# Patient Record
Sex: Female | Born: 1940 | Race: White | Hispanic: No
Health system: Southern US, Community
[De-identification: ages and names within clinical notes are randomized; demographics above are authoritative.]

---

## 2020-04-17 ENCOUNTER — Inpatient Hospital Stay
Admission: RE | Admit: 2020-04-17 | Discharge: 2020-04-30 | Disposition: A | Discharge status: Home / Self Care | Payer: Medicare PPO | Attending: Internal Medicine | Admitting: Internal Medicine

## 2020-04-17 ENCOUNTER — Other Ambulatory Visit (HOSPITAL_COMMUNITY): Payer: Medicare PPO

## 2020-04-17 DIAGNOSIS — Z4659 Encounter for fitting and adjustment of other gastrointestinal appliance and device: Secondary | ICD-10-CM

## 2020-04-17 DIAGNOSIS — J939 Pneumothorax, unspecified: Secondary | ICD-10-CM

## 2020-04-17 DIAGNOSIS — J9690 Respiratory failure, unspecified, unspecified whether with hypoxia or hypercapnia: Secondary | ICD-10-CM

## 2020-04-17 DIAGNOSIS — Z938 Other artificial opening status: Secondary | ICD-10-CM

## 2020-04-17 DIAGNOSIS — J189 Pneumonia, unspecified organism: Secondary | ICD-10-CM

## 2020-04-17 DIAGNOSIS — Z9289 Personal history of other medical treatment: Secondary | ICD-10-CM

## 2020-04-17 DIAGNOSIS — J942 Hemothorax: Secondary | ICD-10-CM

## 2020-04-17 LAB — COMPREHENSIVE METABOLIC PANEL
ALT: 41 U/L (ref 0–44)
AST: 71 U/L — ABNORMAL HIGH (ref 15–41)
Albumin: 2.3 g/dL — ABNORMAL LOW (ref 3.5–5.0)
Alkaline Phosphatase: 93 U/L (ref 38–126)
Anion gap: 13 (ref 5–15)
BUN: 73 mg/dL — ABNORMAL HIGH (ref 8–23)
CO2: 25 mmol/L (ref 22–32)
Calcium: 8.6 mg/dL — ABNORMAL LOW (ref 8.9–10.3)
Chloride: 98 mmol/L (ref 98–111)
Creatinine, Ser: 4.11 mg/dL — ABNORMAL HIGH (ref 0.44–1.00)
GFR, Estimated: 10 mL/min — ABNORMAL LOW (ref >60)
Glucose, Bld: 143 mg/dL — ABNORMAL HIGH (ref 70–99)
Potassium: 3.7 mmol/L (ref 3.5–5.1)
Sodium: 136 mmol/L (ref 135–145)
Total Bilirubin: 1.1 mg/dL (ref 0.3–1.2)
Total Protein: 6.2 g/dL — ABNORMAL LOW (ref 6.5–8.1)

## 2020-04-18 LAB — COMPREHENSIVE METABOLIC PANEL
ALT: 43 U/L (ref 0–44)
AST: 66 U/L — ABNORMAL HIGH (ref 15–41)
Albumin: 2.3 g/dL — ABNORMAL LOW (ref 3.5–5.0)
Alkaline Phosphatase: 97 U/L (ref 38–126)
Anion gap: 15 (ref 5–15)
BUN: 76 mg/dL — ABNORMAL HIGH (ref 8–23)
CO2: 24 mmol/L (ref 22–32)
Calcium: 8.6 mg/dL — ABNORMAL LOW (ref 8.9–10.3)
Chloride: 98 mmol/L (ref 98–111)
Creatinine, Ser: 4.15 mg/dL — ABNORMAL HIGH (ref 0.44–1.00)
GFR, Estimated: 10 mL/min — ABNORMAL LOW (ref >60)
Glucose, Bld: 126 mg/dL — ABNORMAL HIGH (ref 70–99)
Potassium: 3.6 mmol/L (ref 3.5–5.1)
Sodium: 137 mmol/L (ref 135–145)
Total Bilirubin: 1 mg/dL (ref 0.3–1.2)
Total Protein: 6.3 g/dL — ABNORMAL LOW (ref 6.5–8.1)

## 2020-04-18 LAB — CBC WITH DIFFERENTIAL/PLATELET
Abs Immature Granulocytes: 0.09 10*3/uL — ABNORMAL HIGH (ref 0.00–0.07)
Basophils Absolute: 0.1 10*3/uL (ref 0.0–0.1)
Basophils Relative: 1 %
Eosinophils Absolute: 0.9 10*3/uL — ABNORMAL HIGH (ref 0.0–0.5)
Eosinophils Relative: 9 %
HCT: 30 % — ABNORMAL LOW (ref 36.0–46.0)
Hemoglobin: 9.5 g/dL — ABNORMAL LOW (ref 12.0–15.0)
Immature Granulocytes: 1 %
Lymphocytes Relative: 11 %
Lymphs Abs: 1.1 10*3/uL (ref 0.7–4.0)
MCH: 29.8 pg (ref 26.0–34.0)
MCHC: 31.7 g/dL (ref 30.0–36.0)
MCV: 94 fL (ref 80.0–100.0)
Monocytes Absolute: 0.6 10*3/uL (ref 0.1–1.0)
Monocytes Relative: 6 %
Neutro Abs: 6.7 10*3/uL (ref 1.7–7.7)
Neutrophils Relative %: 72 %
Platelets: 375 10*3/uL (ref 150–400)
RBC: 3.19 MIL/uL — ABNORMAL LOW (ref 3.87–5.11)
RDW: 15.3 % (ref 11.5–15.5)
WBC: 9.5 10*3/uL (ref 4.0–10.5)
nRBC: 0 % (ref 0.0–0.2)

## 2020-04-18 LAB — HEMOGLOBIN A1C
Hgb A1c MFr Bld: 5.4 % (ref 4.8–5.6)
Mean Plasma Glucose: 108.28 mg/dL

## 2020-04-18 LAB — HEPATITIS B SURFACE ANTIGEN: Hepatitis B Surface Ag: NONREACTIVE

## 2020-04-19 LAB — HEPATITIS B SURFACE ANTIBODY,QUALITATIVE: Hep B S Ab: REACTIVE — AB

## 2020-04-20 LAB — CBC
HCT: 28.4 % — ABNORMAL LOW (ref 36.0–46.0)
Hemoglobin: 8.8 g/dL — ABNORMAL LOW (ref 12.0–15.0)
MCH: 29.8 pg (ref 26.0–34.0)
MCHC: 31 g/dL (ref 30.0–36.0)
MCV: 96.3 fL (ref 80.0–100.0)
Platelets: 333 10*3/uL (ref 150–400)
RBC: 2.95 MIL/uL — ABNORMAL LOW (ref 3.87–5.11)
RDW: 14.9 % (ref 11.5–15.5)
WBC: 9.3 10*3/uL (ref 4.0–10.5)
nRBC: 0 % (ref 0.0–0.2)

## 2020-04-20 LAB — RENAL FUNCTION PANEL
Albumin: 2.3 g/dL — ABNORMAL LOW (ref 3.5–5.0)
Anion gap: 10 (ref 5–15)
BUN: 46 mg/dL — ABNORMAL HIGH (ref 8–23)
CO2: 27 mmol/L (ref 22–32)
Calcium: 8.6 mg/dL — ABNORMAL LOW (ref 8.9–10.3)
Chloride: 99 mmol/L (ref 98–111)
Creatinine, Ser: 2.89 mg/dL — ABNORMAL HIGH (ref 0.44–1.00)
GFR, Estimated: 16 mL/min — ABNORMAL LOW (ref >60)
Glucose, Bld: 111 mg/dL — ABNORMAL HIGH (ref 70–99)
Phosphorus: 4.4 mg/dL (ref 2.5–4.6)
Potassium: 4.1 mmol/L (ref 3.5–5.1)
Sodium: 136 mmol/L (ref 135–145)

## 2020-04-20 NOTE — Consult Note (Signed)
[facility] CONSULT NOTE    Date: 04/20/2020                  Patient Name:  Christine Cohen  MRN: [id number]  DOB: 1941-04-03  Age / Sex: 79 y.o., female         PCP: Pcp, No                 Service Requesting Consult:  Hospitalist                 Reason for Consult:  Evaluation management of acute kidney injury            History of Present Illness: Patient is a 79 y.o. 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  Patient reports that she was not on dialysis prior to her most recent hospitalization.  Her baseline creatinine recently is range between 0.8-1.0.  She has a right internal jugular PermCath in place.  Currently she has a BUN of 76 with a creatinine of 4.15.  She also has associated anemia with hemoglobin of 9.5.  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  She currently denies nausea, vomiting.   Medications:  Current medications: Amiodarone 200 mg daily, aspirin 81 mg daily, atorvastatin 40 mg daily, duloxetine 30 mg daily, fish oil 1 g daily, heparin 5000 units subcutaneous every 8 hours, hydralazine 12.5 mg every 12 hours, acetaminophen 650 mg 4 times daily, metoprolol 25 mg twice daily, renal vitamin 1 tablet daily, prostat 30 cc daily   Allergies: Penicillin, amlodipine   Past Medical History: hypertension, hyperlipidemia, coronary disease status post CABG times 411/3/21, atrial fibrillation, high chest tube output, acute respiratory failure, acute kidney injury secondary to ischemic ATN  Past Surgical History: Four-vessel CABG 03/25/2020 PermCath placement  Family History: Mother and mother both with history  of coronary artery disease, both deceased  Social History: No current tobacco, alcohol, illicit drug use  Review of Systems: Review of Systems  Constitutional: Positive for malaise/fatigue. Negative for chills and fever.  HENT: Negative for congestion, hearing loss and tinnitus.   Eyes: Negative for blurred vision and double vision.  Respiratory: Positive for shortness of breath. Negative for cough and sputum production.   Cardiovascular: Negative for chest pain, palpitations and orthopnea.  Gastrointestinal: Negative for diarrhea, nausea and vomiting.  Genitourinary: Negative for dysuria, frequency and urgency.  Musculoskeletal: Negative for myalgias.  Skin: Negative for itching and rash.  Neurological: Negative for dizziness and focal weakness.  Endo/Heme/Allergies: Negative for polydipsia. Does not bruise/bleed easily.  Psychiatric/Behavioral: Negative for depression. The patient is not nervous/anxious.      Vital Signs: Temperature 97.4 pulse 82 respirations 16 blood pressure 124/68  Weight trends: There were no vitals filed for this visit.  Physical Exam: Physical Exam: General:  No acute distress  Head:  Normocephalic, atraumatic. Moist oral mucosal membranes  Eyes:  Anicteric  Neck:  Supple  Lungs:   Clear to auscultation, normal effort, chest tube in place  Heart:  S1S2 irregular  Abdomen:   Soft, nontender, bowel sounds present  Extremities:  No peripheral edema.  Neurologic:  Awake, alert, following commands  Skin:  No lesions  Access:  Right IJ  PermCath    Lab results: Basic Metabolic Panel: Recent Labs  Lab 04/17/20 1815 04/18/20 0502  NA 136 137  K 3.7 3.6  CL 98 98  CO2 25 24  GLUCOSE 143* 126*  BUN 73* 76*  CREATININE 4.11* 4.15*  CALCIUM 8.6* 8.6*    Liver Function Tests: Recent Labs  Lab 04/17/20 1815 04/18/20 0502  AST 71* 66*  ALT 41 43  ALKPHOS 93 97  BILITOT 1.1 1.0  PROT 6.2* 6.3*  ALBUMIN 2.3* 2.3*   No results for  input(s): LIPASE, AMYLASE in the last 168 hours. No results for input(s): AMMONIA in the last 168 hours.  CBC: Recent Labs  Lab 04/18/20 0502  WBC 9.5  NEUTROABS 6.7  HGB 9.5*  HCT 30.0*  MCV 94.0  PLT 375    Cardiac Enzymes: No results for input(s): CKTOTAL, CKMB, CKMBINDEX, TROPONINI in the last 168 hours.  BNP: Invalid input(s): POCBNP  CBG: No results for input(s): GLUCAP in the last 168 hours.  Microbiology: No results found for this or any previous visit.  Coagulation Studies: No results for input(s): LABPROT, INR in the last 72 hours.  Urinalysis: No results for input(s): COLORURINE, LABSPEC, PHURINE, GLUCOSEU, HGBUR, BILIRUBINUR, KETONESUR, PROTEINUR, UROBILINOGEN, NITRITE, LEUKOCYTESUR in the last 72 hours.  Invalid input(s): APPERANCEUR    Imaging:  No results found.   Assessment & Plan: Pt is a 79 y.o. 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  1.  Acute kidney injury secondary to ischemic ATN.  Baseline creatinine was 0.8-1.0 preoperatively.  Does not appear to be making much urine.  Continue dialysis on MWF schedule for now.  We will use her right internal jugular PermCath.  2.  Anemia of chronic kidney disease.  Hemoglobin 9.5.  Hold off on Epogen for now.  3.  Secondary hyperparathyroidism.  Serum calcium noted to be a bit low at 8.6.  Albumin also low at 2.3.  Check serum phosphorus as well.  4.  Thanks for consultation.

## 2020-04-21 ENCOUNTER — Other Ambulatory Visit (HOSPITAL_COMMUNITY): Payer: Medicare PPO

## 2020-04-22 LAB — CBC
HCT: 28.2 % — ABNORMAL LOW (ref 36.0–46.0)
Hemoglobin: 9 g/dL — ABNORMAL LOW (ref 12.0–15.0)
MCH: 30.4 pg (ref 26.0–34.0)
MCHC: 31.9 g/dL (ref 30.0–36.0)
MCV: 95.3 fL (ref 80.0–100.0)
Platelets: 309 10*3/uL (ref 150–400)
RBC: 2.96 MIL/uL — ABNORMAL LOW (ref 3.87–5.11)
RDW: 14.7 % (ref 11.5–15.5)
WBC: 9.9 10*3/uL (ref 4.0–10.5)
nRBC: 0 % (ref 0.0–0.2)

## 2020-04-22 LAB — RENAL FUNCTION PANEL
Albumin: 2.3 g/dL — ABNORMAL LOW (ref 3.5–5.0)
Anion gap: 12 (ref 5–15)
BUN: 40 mg/dL — ABNORMAL HIGH (ref 8–23)
CO2: 26 mmol/L (ref 22–32)
Calcium: 8.7 mg/dL — ABNORMAL LOW (ref 8.9–10.3)
Chloride: 99 mmol/L (ref 98–111)
Creatinine, Ser: 2.4 mg/dL — ABNORMAL HIGH (ref 0.44–1.00)
GFR, Estimated: 20 mL/min — ABNORMAL LOW (ref >60)
Glucose, Bld: 102 mg/dL — ABNORMAL HIGH (ref 70–99)
Phosphorus: 3.9 mg/dL (ref 2.5–4.6)
Potassium: 3.7 mmol/L (ref 3.5–5.1)
Sodium: 137 mmol/L (ref 135–145)

## 2020-04-22 NOTE — Progress Notes (Signed)
[facility]  ROUNDING NOTE   Subjective:  Patient seen and evaluated at bedside. Renal parameters have improved with dialysis. Serum phosphorus at target at 3.9.  Objective:  Vital signs in last 24 hours:  Temperature 96.4 pulse 79 respiration 17 blood pressure 132 /64   Physical Exam: General:  No acute distress  Head:  Normocephalic, atraumatic. Moist oral mucosal membranes  Eyes:  Anicteric  Neck:  Status post tracheostomy decannulation  Lungs:   Clear to auscultation, normal effort  Heart:  S1S2 no rubs  Abdomen:   Soft, nontender, bowel sounds present  Extremities:  Trace peripheral edema.  Neurologic:  Awake, alert, following commands  Skin:  No lesions  Access:  Right IJ PermCath    Basic Metabolic Panel: Recent Labs  Lab 04/17/20 1815 04/17/20 1815 04/18/20 0502 04/20/20 1014 04/22/20 0426  NA 136  --  137 136 137  K 3.7  --  3.6 4.1 3.7  CL 98  --  98 99 99  CO2 25  --  24 27 26   GLUCOSE 143*  --  126* 111* 102*  BUN 73*  --  76* 46* 40*  CREATININE 4.11*  --  4.15* 2.89* 2.40*  CALCIUM 8.6*   < > 8.6* 8.6* 8.7*  PHOS  --   --   --  4.4 3.9   < > = values in this interval not displayed.    Liver Function Tests: Recent Labs  Lab 04/17/20 1815 04/18/20 0502 04/20/20 1014 04/22/20 0426  AST 71* 66*  --   --   ALT 41 43  --   --   ALKPHOS 93 97  --   --   BILITOT 1.1 1.0  --   --   PROT 6.2* 6.3*  --   --   ALBUMIN 2.3* 2.3* 2.3* 2.3*   No results for input(s): LIPASE, AMYLASE in the last 168 hours. No results for input(s): AMMONIA in the last 168 hours.  CBC: Recent Labs  Lab 04/18/20 0502 04/20/20 1014 04/22/20 0426  WBC 9.5 9.3 9.9  NEUTROABS 6.7  --   --   HGB 9.5* 8.8* 9.0*  HCT 30.0* 28.4* 28.2*  MCV 94.0 96.3 95.3  PLT 375 333 309    Cardiac Enzymes: No results for input(s): CKTOTAL, CKMB, CKMBINDEX, TROPONINI in the last 168 hours.  BNP: Invalid input(s): POCBNP  CBG: No results for input(s): GLUCAP in the  last 168 hours.  Microbiology: No results found for this or any previous visit.  Coagulation Studies: No results for input(s): LABPROT, INR in the last 72 hours.  Urinalysis: No results for input(s): COLORURINE, LABSPEC, PHURINE, GLUCOSEU, HGBUR, BILIRUBINUR, KETONESUR, PROTEINUR, UROBILINOGEN, NITRITE, LEUKOCYTESUR in the last 72 hours.  Invalid input(s): APPERANCEUR    Imaging: DG CHEST PORT 1 VIEW  Result Date: 04/21/2020 CLINICAL DATA:  Follow-up pneumothorax. EXAM: PORTABLE CHEST 1 VIEW COMPARISON:  04/17/20 FINDINGS: Interval removal of the feeding tube. Right chest wall dual lumen catheter is noted with tip in the right atrium. Cardiac enlargement is unchanged. Aortic atherosclerotic calcifications. Left lower chest to is again noted. No significant pneumothorax identified on today's study. Veil like opacification of the left midlung and left base is unchanged. IMPRESSION: 1. Left chest tube in place, no pneumothorax identified on today's study. 2. No change in aeration to the left mid and lower lung. Electronically Signed   By: 04/19/20 M.D.   On: 04/21/2020 11:44     Medications:  Assessment/ Plan:  79 y.o. 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  1.  Acute kidney injury secondary to ischemic ATN.  The patient's renal parameters have improved with dialysis.  We will continue dialysis schedule on MWF for now.  Monitor urine output closely to see if she can come off of dialysis.  2.  Anemia of chronic kidney disease.  Hemoglobin 9.0.  Consider adding Epogen if hemoglobin drops further.  3.  Secondary hyperparathyroidism.  Most recent serum phosphorus was 3.9 with a calcium of 8.7.  No immediate need  for Hectorol.   LOS: 0 Jene Huq 12/1/202111:08 AM

## 2020-04-23 ENCOUNTER — Other Ambulatory Visit (HOSPITAL_COMMUNITY): Payer: Medicare PPO

## 2020-04-24 ENCOUNTER — Other Ambulatory Visit (HOSPITAL_COMMUNITY): Payer: Medicare PPO

## 2020-04-24 LAB — RENAL FUNCTION PANEL
Albumin: 2.3 g/dL — ABNORMAL LOW (ref 3.5–5.0)
Anion gap: 13 (ref 5–15)
BUN: 27 mg/dL — ABNORMAL HIGH (ref 8–23)
CO2: 25 mmol/L (ref 22–32)
Calcium: 8.6 mg/dL — ABNORMAL LOW (ref 8.9–10.3)
Chloride: 98 mmol/L (ref 98–111)
Creatinine, Ser: 2.2 mg/dL — ABNORMAL HIGH (ref 0.44–1.00)
GFR, Estimated: 22 mL/min — ABNORMAL LOW (ref >60)
Glucose, Bld: 102 mg/dL — ABNORMAL HIGH (ref 70–99)
Phosphorus: 3.7 mg/dL (ref 2.5–4.6)
Potassium: 3.7 mmol/L (ref 3.5–5.1)
Sodium: 136 mmol/L (ref 135–145)

## 2020-04-24 LAB — CBC
HCT: 27.7 % — ABNORMAL LOW (ref 36.0–46.0)
Hemoglobin: 8.6 g/dL — ABNORMAL LOW (ref 12.0–15.0)
MCH: 29.5 pg (ref 26.0–34.0)
MCHC: 31 g/dL (ref 30.0–36.0)
MCV: 94.9 fL (ref 80.0–100.0)
Platelets: 267 10*3/uL (ref 150–400)
RBC: 2.92 MIL/uL — ABNORMAL LOW (ref 3.87–5.11)
RDW: 14.6 % (ref 11.5–15.5)
WBC: 11.8 10*3/uL — ABNORMAL HIGH (ref 4.0–10.5)
nRBC: 0 % (ref 0.0–0.2)

## 2020-04-24 NOTE — Progress Notes (Signed)
[facility]  ROUNDING NOTE   Subjective:  Patient states that she is making very little urine at this time. Resting comfortably in bed at the moment. Family requests update.  Objective:  Vital signs in last 24 hours:  Temperature 97.3 pulse 77 respirations 14 blood pressure 150/71   Physical Exam: General:  No acute distress  Head:  Normocephalic, atraumatic. Moist oral mucosal membranes  Eyes:  Anicteric  Neck:  Status post tracheostomy decannulation  Lungs:   Clear to auscultation, normal effort  Heart:  S1S2 no rubs  Abdomen:   Soft, nontender, bowel sounds present  Extremities:  Trace peripheral edema.  Neurologic:  Awake, alert, following commands  Skin:  No lesions  Access:  Right IJ PermCath    Basic Metabolic Panel: Recent Labs  Lab 04/17/20 1815 04/17/20 1815 04/18/20 0502 04/18/20 0502 04/20/20 1014 04/22/20 0426 04/24/20 0534  NA 136  --  137  --  136 137 136  K 3.7  --  3.6  --  4.1 3.7 3.7  CL 98  --  98  --  99 99 98  CO2 25  --  24  --  _0 GLUCOSE 143*  --  126*  --  111* 102* 102*  BUN 73*  --  76*  --  46* 40* 27*  CREATININE 4.11*  --  4.15*  --  2.89* 2.40* 2.20*  CALCIUM 8.6*   < > 8.6*   < > 8.6* 8.7* 8.6*  PHOS  --   --   --   --  4.4 3.9 3.7   < > = values in this interval not displayed.    Liver Function Tests: Recent Labs  Lab 04/17/20 1815 04/18/20 0502 04/20/20 1014 04/22/20 0426 04/24/20 0534  AST 71* 66*  --   --   --   ALT 41 43  --   --   --   ALKPHOS 93 97  --   --   --   BILITOT 1.1 1.0  --   --   --   PROT 6.2* 6.3*  --   --   --   ALBUMIN 2.3* 2.3* 2.3* 2.3* 2.3*   No results for input(s): LIPASE, AMYLASE in the last 168 hours. No results for input(s): AMMONIA in the last 168 hours.  CBC: Recent Labs  Lab 04/18/20 0502 04/20/20 1014 04/22/20 0426 04/24/20 0537  WBC 9.5 9.3 9.9 11.8*  NEUTROABS 6.7  --   --   --   HGB 9.5* 8.8* 9.0* 8.6*  HCT 30.0* 28.4* 28.2* 27.7*  MCV 94.0 96.3 95.3  94.9  PLT 375 333 309 267    Cardiac Enzymes: No results for input(s): CKTOTAL, CKMB, CKMBINDEX, TROPONINI in the last 168 hours.  BNP: Invalid input(s): POCBNP  CBG: No results for input(s): GLUCAP in the last 168 hours.  Microbiology: No results found for this or any previous visit.  Coagulation Studies: No results for input(s): LABPROT, INR in the last 72 hours.  Urinalysis: No results for input(s): COLORURINE, LABSPEC, PHURINE, GLUCOSEU, HGBUR, BILIRUBINUR, KETONESUR, PROTEINUR, UROBILINOGEN, NITRITE, LEUKOCYTESUR in the last 72 hours.  Invalid input(s): APPERANCEUR    Imaging: DG Chest Port 1 View  Result Date: 04/24/2020 CLINICAL DATA:  Pneumothorax.  Recent chest tube removal. EXAM: PORTABLE CHEST 1 VIEW COMPARISON:  04/23/2020. FINDINGS: Dual-lumen catheter stable position. Prior CABG. Left atrial appendage clip in stable position. Stable cardiomegaly. No pulmonary venous congestion. Stable dense atelectasis/consolidation left lung base.  Stable moderate left-sided pleural effusion. No pneumothorax. IMPRESSION: 1. Dual-lumen catheter in stable position. 2. Prior CABG. Stable cardiomegaly. No pulmonary venous congestion. 3. dense atelectasis/consolidation left lung base and moderate left pleural effusion. No interim change. Electronically Signed   By: Marcello Moores  Register   On: 04/24/2020 06:39   DG CHEST PORT 1 VIEW  Result Date: 04/23/2020 CLINICAL DATA:  Chest tube placement. EXAM: PORTABLE CHEST 1 VIEW COMPARISON:  04/23/2020 at 6:14 a.m.  Older exams. FINDINGS: A chest tube was present on the earlier exam, but is no longer visualized. No pneumothorax. There is persistent opacity at the left lung base partly silhouetting the left heart border and silhouetting the left hemidiaphragm. Left upper lung and right lung are clear. Stable changes from previous cardiac surgery. Right internal jugular tunneled dual lumen catheter is stable. IMPRESSION: 1. Status post removal of the  inferior left hemithorax chest tube. No pneumothorax. 2. Persistent left lung base opacity consistent with consolidation/atelectasis, likely with a component of residual fluid. No new lung abnormalities. Electronically Signed   By: Lajean Manes M.D.   On: 04/23/2020 16:02   DG Chest Port 1 View  Result Date: 04/23/2020 CLINICAL DATA:  Dual-lumen catheter.  Cardiac enlargement. EXAM: PORTABLE CHEST 1 VIEW COMPARISON:  04/21/2020. FINDINGS: Right-sided dual-lumen catheter stable position. Left chest tube in stable position. No pneumothorax. Prior CABG. Left atrial appendage clip in stable position. Stable cardiomegaly. Persistent dense atelectasis and consolidation left lung base. Persistent infiltrate/edema left mid lung. Left pleural effusion again noted. Similar findings noted on prior exam. IMPRESSION: 1. Right sided dual-lumen catheter and left chest tube in stable position. No pneumothorax. 2. Prior CABG. Stable cardiomegaly. 3. Persistent dense atelectasis and consolidation left lung base. Persistent infiltrate/edema left mid lung. Left pleural effusion again noted. Electronically Signed   By: Marcello Moores  Register   On: 04/23/2020 07:29     Medications:       Assessment/ Plan:  79 y.o. 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  1.  Acute kidney injury secondary to ischemic ATN.  Creatinine currently 2.2 with an EGFR of 22.  However patient reports making very little urine.  We will need to monitor this over the weekend.  For now we will plan for dialysis treatment today.  2.  Anemia of chronic kidney disease.  Hemoglobin currently 8.6.  Continue to consider Epogen.  3.  Secondary hyperparathyroidism.  Phosphorus at target at 3.7.   Continue periodically monitor.   LOS: 0 Christine Cohen 12/3/20217:56 AM

## 2020-04-26 LAB — URINALYSIS, ROUTINE W REFLEX MICROSCOPIC
Bilirubin Urine: NEGATIVE
Glucose, UA: NEGATIVE mg/dL
Ketones, ur: NEGATIVE mg/dL
Nitrite: NEGATIVE
Protein, ur: 100 mg/dL — AB
Specific Gravity, Urine: 1.016 (ref 1.005–1.030)
WBC, UA: >50 WBC/hpf — ABNORMAL HIGH (ref 0–5)
pH: 6 (ref 5.0–8.0)

## 2020-04-27 ENCOUNTER — Other Ambulatory Visit (HOSPITAL_COMMUNITY): Payer: Medicare PPO

## 2020-04-27 HISTORY — PX: IR REMOVAL TUN CV CATH W/O FL: IMG2289

## 2020-04-27 LAB — RENAL FUNCTION PANEL
Albumin: 2.2 g/dL — ABNORMAL LOW (ref 3.5–5.0)
Anion gap: 12 (ref 5–15)
BUN: 25 mg/dL — ABNORMAL HIGH (ref 8–23)
CO2: 25 mmol/L (ref 22–32)
Calcium: 8.4 mg/dL — ABNORMAL LOW (ref 8.9–10.3)
Chloride: 99 mmol/L (ref 98–111)
Creatinine, Ser: 1.82 mg/dL — ABNORMAL HIGH (ref 0.44–1.00)
GFR, Estimated: 28 mL/min — ABNORMAL LOW (ref >60)
Glucose, Bld: 94 mg/dL (ref 70–99)
Phosphorus: 3.9 mg/dL (ref 2.5–4.6)
Potassium: 3.5 mmol/L (ref 3.5–5.1)
Sodium: 136 mmol/L (ref 135–145)

## 2020-04-27 LAB — CBC
HCT: 26 % — ABNORMAL LOW (ref 36.0–46.0)
Hemoglobin: 8.6 g/dL — ABNORMAL LOW (ref 12.0–15.0)
MCH: 30.5 pg (ref 26.0–34.0)
MCHC: 33.1 g/dL (ref 30.0–36.0)
MCV: 92.2 fL (ref 80.0–100.0)
Platelets: 268 10*3/uL (ref 150–400)
RBC: 2.82 MIL/uL — ABNORMAL LOW (ref 3.87–5.11)
RDW: 14.6 % (ref 11.5–15.5)
WBC: 9.2 10*3/uL (ref 4.0–10.5)
nRBC: 0 % (ref 0.0–0.2)

## 2020-04-27 MED ORDER — CHLORHEXIDINE GLUCONATE 4 % EX LIQD
CUTANEOUS | Status: AC
  Filled 2020-04-27: qty 15

## 2020-04-27 MED ORDER — LIDOCAINE HCL 1 % IJ SOLN
INTRAMUSCULAR | Status: AC
  Filled 2020-04-27: qty 20

## 2020-04-27 MED ORDER — LIDOCAINE HCL (PF) 1 % IJ SOLN
INTRAMUSCULAR | Status: AC | PRN
  Administered 2020-04-27: 10 mL

## 2020-04-27 NOTE — Progress Notes (Signed)
  [facility]  ROUNDING NOTE   Subjective:  Renal function improving. BUN down to 25 with a creatinine of 1.8. Urine output was 1.3 L over the preceding 24 hours.  Objective:  Vital signs in last 24 hours:  Temperature 97.1 pulse 80 respirations 18 blood pressure 133/63   Physical Exam: General:  No acute distress  Head:  Normocephalic, atraumatic. Moist oral mucosal membranes  Eyes:  Anicteric  Neck:  Status post tracheostomy decannulation  Lungs:   Clear to auscultation, normal effort  Heart:  S1S2 no rubs  Abdomen:   Soft, nontender, bowel sounds present  Extremities:  Trace peripheral edema.  Neurologic:  Awake, alert, following commands  Skin:  No lesions  Access:  Right IJ PermCath    Basic Metabolic Panel: Recent Labs  Lab 04/20/20 1014 04/20/20 1014 04/22/20 0426 04/24/20 0534 04/27/20 0629  NA 136  --  137 136 136  K 4.1  --  3.7 3.7 3.5  CL 99  --  99 98 99  CO2 27  --  _0 GLUCOSE 111*  --  102* 102* 94  BUN 46*  --  40* 27* 25*  CREATININE 2.89*  --  2.40* 2.20* 1.82*  CALCIUM 8.6*   < > 8.7* 8.6* 8.4*  PHOS 4.4  --  3.9 3.7 3.9   < > = values in this interval not displayed.    Liver Function Tests: Recent Labs  Lab 04/20/20 1014 04/22/20 0426 04/24/20 0534 04/27/20 0629  ALBUMIN 2.3* 2.3* 2.3* 2.2*   No results for input(s): LIPASE, AMYLASE in the last 168 hours. No results for input(s): AMMONIA in the last 168 hours.  CBC: Recent Labs  Lab 04/20/20 1014 04/22/20 0426 04/24/20 0537 04/27/20 0629  WBC 9.3 9.9 11.8* 9.2  HGB 8.8* 9.0* 8.6* 8.6*  HCT 28.4* 28.2* 27.7* 26.0*  MCV 96.3 95.3 94.9 92.2  PLT 333 309 267 268    Cardiac Enzymes: No results for input(s): CKTOTAL, CKMB, CKMBINDEX, TROPONINI in the last 168 hours.  BNP: Invalid input(s): POCBNP  CBG: No results for input(s): GLUCAP in the last 168 hours.  Microbiology: No results found for this or any previous visit.  Coagulation Studies: No  results for input(s): LABPROT, INR in the last 72 hours.  Urinalysis: Recent Labs    04/26/20 0900  COLORURINE AMBER*  LABSPEC 1.016  PHURINE 6.0  GLUCOSEU NEGATIVE  HGBUR SMALL*  BILIRUBINUR NEGATIVE  KETONESUR NEGATIVE  PROTEINUR 100*  NITRITE NEGATIVE  LEUKOCYTESUR LARGE*      Imaging: No results found.   Medications:       Assessment/ Plan:  79 y.o. 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  1.  Acute kidney injury secondary to ischemic ATN.  Renal function improving.  Creatinine down to 1.8 with an EGFR of 28.  Therefore no additional need for dialysis.  We will request IR to remove PermCath.  2.  Anemia of chronic kidney disease.  May need to consider Epogen as an outpatient.  Hold off for now.  3.  Secondary hyperparathyroidism.  Phosphorus acceptable at 3.9.  Calcium 8.4.  Continue to monitor bone mineral metabolism parameters.   LOS: 0 Christine Cohen 12/6/20218:18 AM

## 2020-04-27 NOTE — Procedures (Signed)
Pre procedural Dx: AKI Post procedural Dx: Same  Patient with recovery of renal function per nephrology, request to remove tunneled HD catheter placed at OSH sometime in November per patient.  Successful removal of tunneled right IJ HD catheter - suture, cuff and catheter removed in tact.  EBL: <1 mL No immediate complications.  Please see imaging section of Epic for full dictation.  Villa Herb PA-C 04/27/2020 11:44 AM

## 2020-04-28 LAB — URINE CULTURE: Culture: >=100000 — AB

## 2020-04-29 ENCOUNTER — Other Ambulatory Visit (HOSPITAL_COMMUNITY): Payer: Medicare PPO

## 2020-04-29 LAB — RENAL FUNCTION PANEL
Albumin: 2.2 g/dL — ABNORMAL LOW (ref 3.5–5.0)
Anion gap: 11 (ref 5–15)
BUN: 23 mg/dL (ref 8–23)
CO2: 23 mmol/L (ref 22–32)
Calcium: 8.3 mg/dL — ABNORMAL LOW (ref 8.9–10.3)
Chloride: 101 mmol/L (ref 98–111)
Creatinine, Ser: 1.47 mg/dL — ABNORMAL HIGH (ref 0.44–1.00)
GFR, Estimated: 36 mL/min — ABNORMAL LOW (ref >60)
Glucose, Bld: 114 mg/dL — ABNORMAL HIGH (ref 70–99)
Phosphorus: 3.9 mg/dL (ref 2.5–4.6)
Potassium: 3.4 mmol/L — ABNORMAL LOW (ref 3.5–5.1)
Sodium: 135 mmol/L (ref 135–145)

## 2020-04-29 LAB — CBC
HCT: 27 % — ABNORMAL LOW (ref 36.0–46.0)
Hemoglobin: 8.5 g/dL — ABNORMAL LOW (ref 12.0–15.0)
MCH: 29.8 pg (ref 26.0–34.0)
MCHC: 31.5 g/dL (ref 30.0–36.0)
MCV: 94.7 fL (ref 80.0–100.0)
Platelets: 260 10*3/uL (ref 150–400)
RBC: 2.85 MIL/uL — ABNORMAL LOW (ref 3.87–5.11)
RDW: 14.6 % (ref 11.5–15.5)
WBC: 8.7 10*3/uL (ref 4.0–10.5)
nRBC: 0 % (ref 0.0–0.2)

## 2020-04-29 LAB — BASIC METABOLIC PANEL
Anion gap: 13 (ref 5–15)
BUN: 23 mg/dL (ref 8–23)
CO2: 22 mmol/L (ref 22–32)
Calcium: 8.3 mg/dL — ABNORMAL LOW (ref 8.9–10.3)
Chloride: 101 mmol/L (ref 98–111)
Creatinine, Ser: 1.5 mg/dL — ABNORMAL HIGH (ref 0.44–1.00)
GFR, Estimated: 35 mL/min — ABNORMAL LOW (ref >60)
Glucose, Bld: 115 mg/dL — ABNORMAL HIGH (ref 70–99)
Potassium: 3.4 mmol/L — ABNORMAL LOW (ref 3.5–5.1)
Sodium: 136 mmol/L (ref 135–145)

## 2020-04-29 NOTE — Progress Notes (Signed)
[facility]  ROUNDING NOTE   Subjective:  Patient continues to improve from a renal perspective. Creatinine down to 1.4 with an EGFR of 36. Dialysis catheter removed.  Objective:  Vital signs in last 24 hours:  Temperature 97.7 pulse 79 respirations 19 blood pressure 120/59 urine output 850 cc   Physical Exam: General:  No acute distress  Head:  Normocephalic, atraumatic. Moist oral mucosal membranes  Eyes:  Anicteric  Neck:  Status post tracheostomy decannulation  Lungs:   Clear to auscultation, normal effort  Heart:  S1S2 no rubs  Abdomen:   Soft, nontender, bowel sounds present  Extremities:  Trace peripheral edema.  Neurologic:  Awake, alert, following commands  Skin:  No lesions  Access:  Right IJ PermCath removed    Basic Metabolic Panel: Recent Labs  Lab 04/24/20 0534 04/27/20 0629 04/29/20 0658  NA 136 136 136  135  K 3.7 3.5 3.4*  3.4*  CL 98 99 101  101  CO2 _0 GLUCOSE 102* 94 115*  114*  BUN 27* 25* 23  23  CREATININE 2.20* 1.82* 1.50*  1.47*  CALCIUM 8.6* 8.4* 8.3*  8.3*  PHOS 3.7 3.9 3.9    Liver Function Tests: Recent Labs  Lab 04/24/20 0534 04/27/20 0629 04/29/20 0658  ALBUMIN 2.3* 2.2* 2.2*   No results for input(s): LIPASE, AMYLASE in the last 168 hours. No results for input(s): AMMONIA in the last 168 hours.  CBC: Recent Labs  Lab 04/24/20 0537 04/27/20 0629 04/29/20 0658  WBC 11.8* 9.2 8.7  HGB 8.6* 8.6* 8.5*  HCT 27.7* 26.0* 27.0*  MCV 94.9 92.2 94.7  PLT 267 268 260    Cardiac Enzymes: No results for input(s): CKTOTAL, CKMB, CKMBINDEX, TROPONINI in the last 168 hours.  BNP: Invalid input(s): POCBNP  CBG: No results for input(s): GLUCAP in the last 168 hours.  Microbiology: Results for orders placed or performed during the hospital encounter of 04/17/20  Culture, Urine     Status: Abnormal   Collection Time: 04/26/20  9:00 AM   Specimen: Urine, Random  Result Value Ref Range Status    Specimen Description URINE, RANDOM  Final   Special Requests   Final    NONE Performed at [facility], [address], [place], [place] [address]    Culture >=100,000 COLONIES/mL CITROBACTER BRAAKII (A)  Final   Report Status 04/28/2020 FINAL  Final   Organism ID, Bacteria CITROBACTER BRAAKII (A)  Final      Susceptibility   Citrobacter braakii - MIC*    CEFAZOLIN >=64 RESISTANT Resistant     CEFEPIME 0.5 SENSITIVE Sensitive     CEFTRIAXONE >=64 RESISTANT Resistant     CIPROFLOXACIN <=0.25 SENSITIVE Sensitive     GENTAMICIN <=1 SENSITIVE Sensitive     IMIPENEM <=0.25 SENSITIVE Sensitive     NITROFURANTOIN <=16 SENSITIVE Sensitive     TRIMETH/SULFA <=20 SENSITIVE Sensitive     PIP/TAZO >=128 RESISTANT Resistant     * >=100,000 COLONIES/mL CITROBACTER BRAAKII    Coagulation Studies: No results for input(s): LABPROT, INR in the last 72 hours.  Urinalysis: Recent Labs    04/26/20 0900  COLORURINE AMBER*  LABSPEC 1.016  PHURINE 6.0  GLUCOSEU NEGATIVE  HGBUR SMALL*  BILIRUBINUR NEGATIVE  KETONESUR NEGATIVE  PROTEINUR 100*  NITRITE NEGATIVE  LEUKOCYTESUR LARGE*      Imaging: IR Removal Tun Cv Cath W/O FL  Result Date: 04/27/2020 INDICATION: Patient with history of acute kidney injury  status post tunneled right IJ hemodialysis catheter placement at outside hospital. Patient now with recovery of renal function, request for removal of tunneled HD catheter. EXAM: REMOVAL TUNNELED CENTRAL VENOUS CATHETER MEDICATIONS: 4 mL 1% lidocaine none. ANESTHESIA/SEDATION: None FLUOROSCOPY TIME:  None COMPLICATIONS: None immediate. PROCEDURE: Informed written consent was obtained from the patient after a thorough discussion of the procedural risks, benefits and alternatives. All questions were addressed. Maximal Sterile Barrier Technique was utilized including caps, mask, sterile gowns, sterile gloves, sterile drape, hand hygiene and skin antiseptic. A timeout was performed prior to  the initiation of the procedure. The patient's right chest and catheter was prepped and draped in a normal sterile fashion. Heparin was removed from both ports of catheter. 1% lidocaine was used for local anesthesia. Using gentle blunt dissection the cuff of the catheter was exposed and the catheter was removed in it's entirety. Pressure was held until hemostasis was obtained. A sterile dressing was applied. The patient tolerated the procedure well with no immediate complications. IMPRESSION: Successful catheter removal as described above. Read by Candiss Norse, PA-C Electronically Signed   By: Jerilynn Mages.  Shick M.D.   On: 04/27/2020 11:50     Medications:       Assessment/ Plan:  79 y.o. 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  1.  Acute kidney injury secondary to ischemic ATN.  The patient has made excellent recovery in terms of renal function over the course of this hospitalization.  Creatinine down to 1.47 with urine output of 850 cc recorded over the preceding 24 hours.  PermCath has been removed.  Recommend that she continue to follow with a nephrologist as an outpatient in [place].  2.  Anemia of chronic kidney disease.  Hemoglobin currently 8.5.  She may end up requiring Epogen as an outpatient.  Recommend that she see nephrology and/or hematology as an outpatient.  3.  Secondary hyperparathyroidism.  Continue to monitor bone mineral metabolism parameters as an outpatient.   LOS: 0 Christine Cohen 12/8/20218:58 AM

## 2021-11-15 IMAGING — DX DG CHEST 1V PORT
1 series · 1 of 1 positions shown · non-contrast
Comparison: 04/23/2020.

CLINICAL DATA: Pneumothorax.  Recent chest tube removal.

EXAM:
PORTABLE CHEST 1 VIEW

[chest ap]
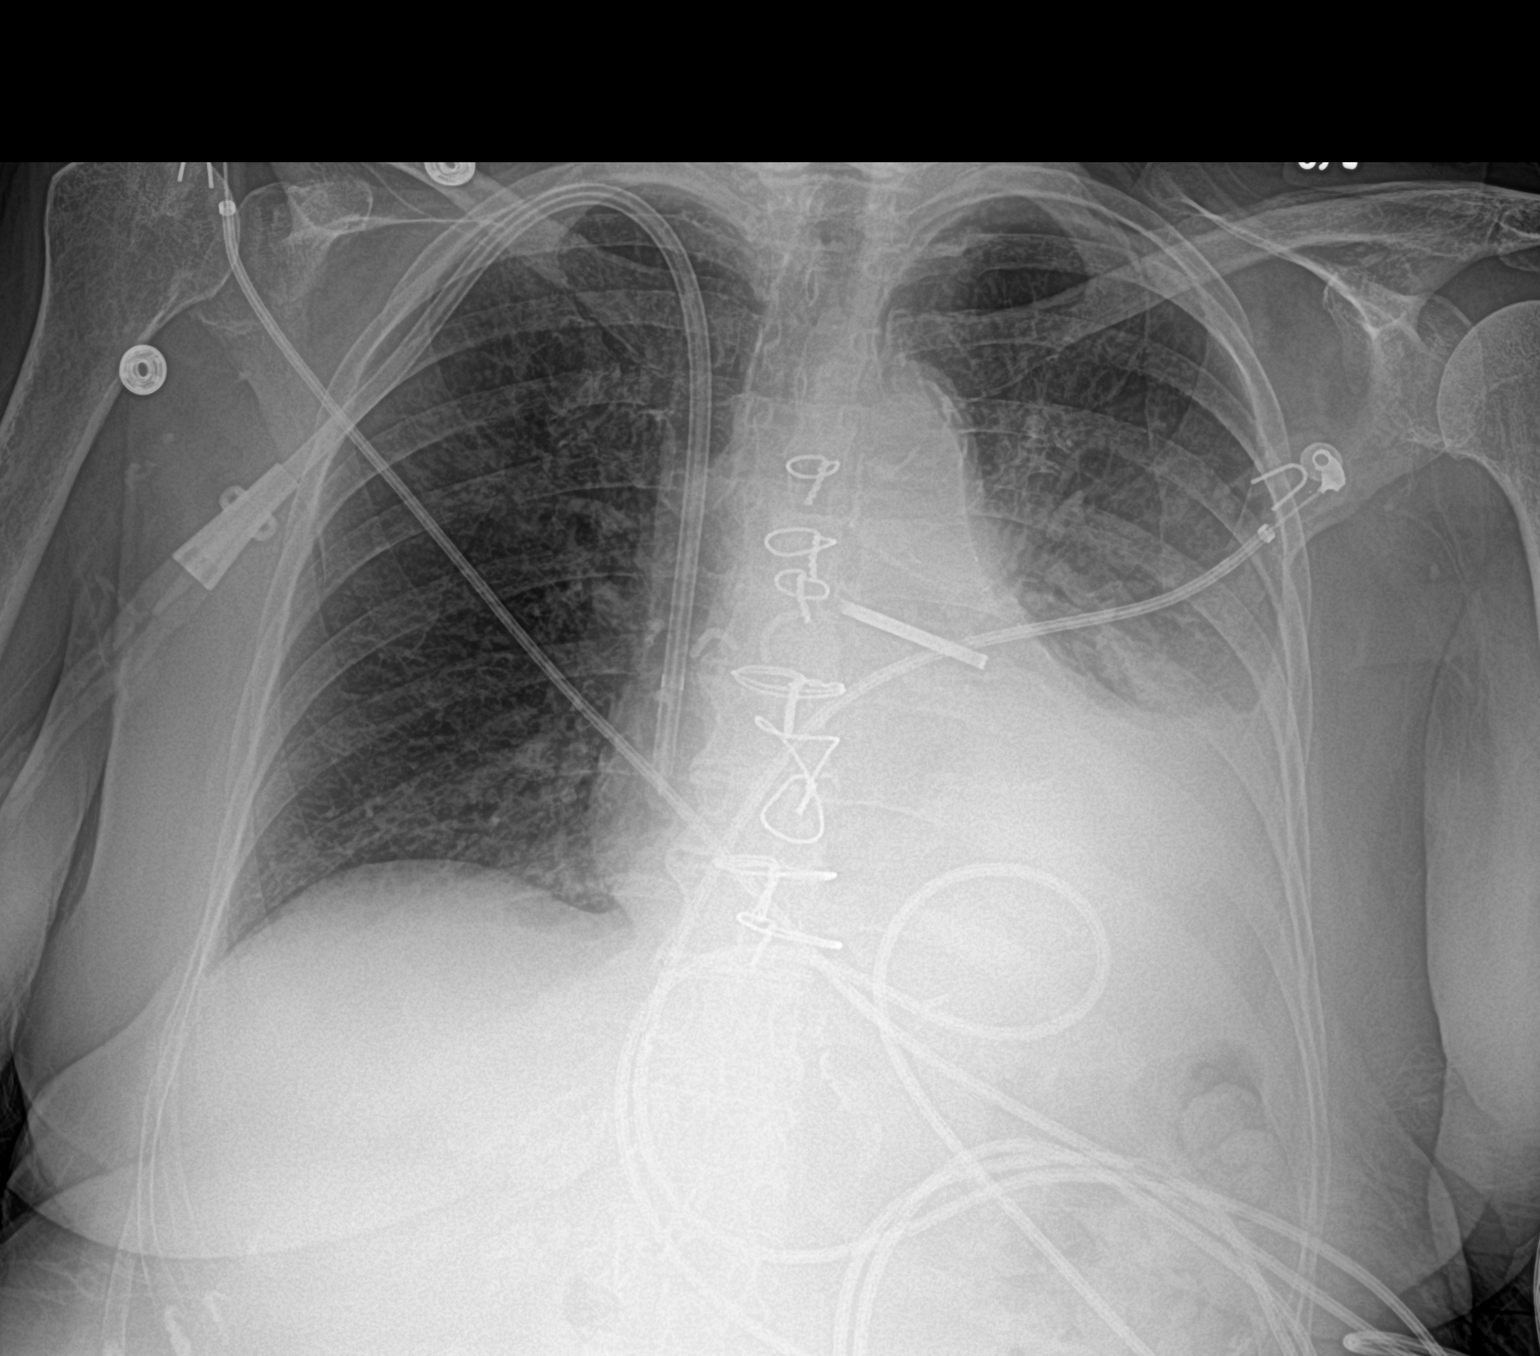

[1 of 1 positions shown; findings below may reference images not displayed]

FINDINGS: Dual-lumen catheter stable position. Prior CABG. Left atrial
appendage clip in stable position. Stable cardiomegaly. No pulmonary
venous congestion. Stable dense atelectasis/consolidation left lung
base. Stable moderate left-sided pleural effusion. No pneumothorax.
IMPRESSION: 1. Dual-lumen catheter in stable position.
2. Prior CABG. Stable cardiomegaly. No pulmonary venous congestion.
3. dense atelectasis/consolidation left lung base and moderate left
pleural effusion. No interim change.
